# Patient Record
Sex: Male | Born: 1965 | Race: White | Hispanic: No | Smoking: Current every day smoker
Health system: Southern US, Community
[De-identification: ages and names within clinical notes are randomized; demographics above are authoritative.]

---

## 2004-11-20 ENCOUNTER — Ambulatory Visit (HOSPITAL_BASED_OUTPATIENT_CLINIC_OR_DEPARTMENT_OTHER): Admission: RE | Admit: 2004-11-20 | Discharge: 2004-11-20 | Payer: Self-pay | Admitting: *Deleted

## 2004-11-20 ENCOUNTER — Ambulatory Visit (HOSPITAL_COMMUNITY): Admission: RE | Admit: 2004-11-20 | Discharge: 2004-11-20 | Payer: Self-pay | Admitting: *Deleted

## 2005-05-05 ENCOUNTER — Emergency Department (HOSPITAL_COMMUNITY): Admission: EM | Admit: 2005-05-05 | Discharge: 2005-05-06 | Payer: Self-pay | Admitting: Emergency Medicine

## 2005-08-29 ENCOUNTER — Ambulatory Visit (HOSPITAL_COMMUNITY): Admission: RE | Admit: 2005-08-29 | Discharge: 2005-08-29 | Payer: Self-pay | Admitting: Family Medicine

## 2007-12-09 ENCOUNTER — Emergency Department (HOSPITAL_COMMUNITY): Admission: EM | Admit: 2007-12-09 | Discharge: 2007-12-09 | Payer: Self-pay | Admitting: Emergency Medicine

## 2008-03-13 ENCOUNTER — Inpatient Hospital Stay (HOSPITAL_COMMUNITY): Admission: EM | Admit: 2008-03-13 | Discharge: 2008-03-16 | Payer: Self-pay | Admitting: *Deleted

## 2008-12-06 ENCOUNTER — Emergency Department (HOSPITAL_COMMUNITY): Admission: EM | Admit: 2008-12-06 | Discharge: 2008-12-06 | Payer: Self-pay | Admitting: Emergency Medicine

## 2009-07-07 ENCOUNTER — Emergency Department (HOSPITAL_COMMUNITY): Admission: EM | Admit: 2009-07-07 | Discharge: 2009-07-07 | Payer: Self-pay | Admitting: Emergency Medicine

## 2009-07-21 ENCOUNTER — Ambulatory Visit: Payer: Self-pay | Admitting: Internal Medicine

## 2009-07-21 ENCOUNTER — Inpatient Hospital Stay (HOSPITAL_COMMUNITY): Admission: EM | Admit: 2009-07-21 | Discharge: 2009-07-22 | Payer: Self-pay | Admitting: Emergency Medicine

## 2009-07-22 ENCOUNTER — Ambulatory Visit: Payer: Self-pay | Admitting: Surgery

## 2009-07-22 ENCOUNTER — Encounter: Payer: Self-pay | Admitting: Internal Medicine

## 2009-07-24 ENCOUNTER — Encounter: Payer: Self-pay | Admitting: Internal Medicine

## 2009-07-25 ENCOUNTER — Encounter: Payer: Self-pay | Admitting: Internal Medicine

## 2009-07-25 ENCOUNTER — Telehealth: Payer: Self-pay | Admitting: Internal Medicine

## 2009-08-07 ENCOUNTER — Emergency Department (HOSPITAL_COMMUNITY): Admission: EM | Admit: 2009-08-07 | Discharge: 2009-08-08 | Payer: Self-pay | Admitting: Emergency Medicine

## 2010-01-10 IMAGING — CR DG LUMBAR SPINE 2-3V
3 series · 3 of 3 positions shown · non-contrast
Comparison: None

CLINICAL DATA: Hip pain

LUMBAR SPINE - 2-3 VIEW

[t l-spine a.p.]
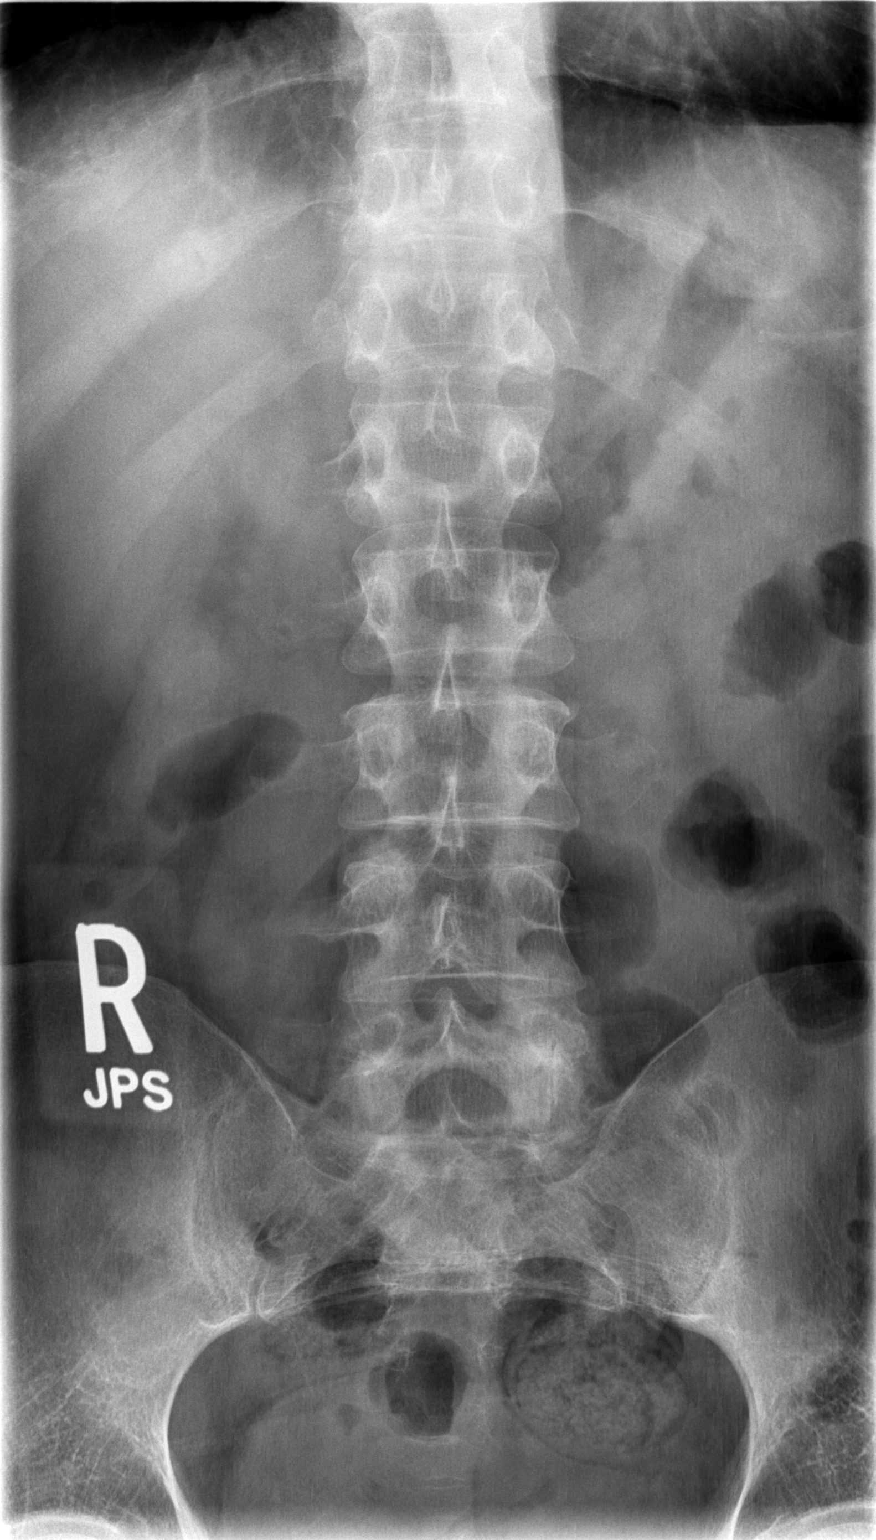

[t l-spine lat]
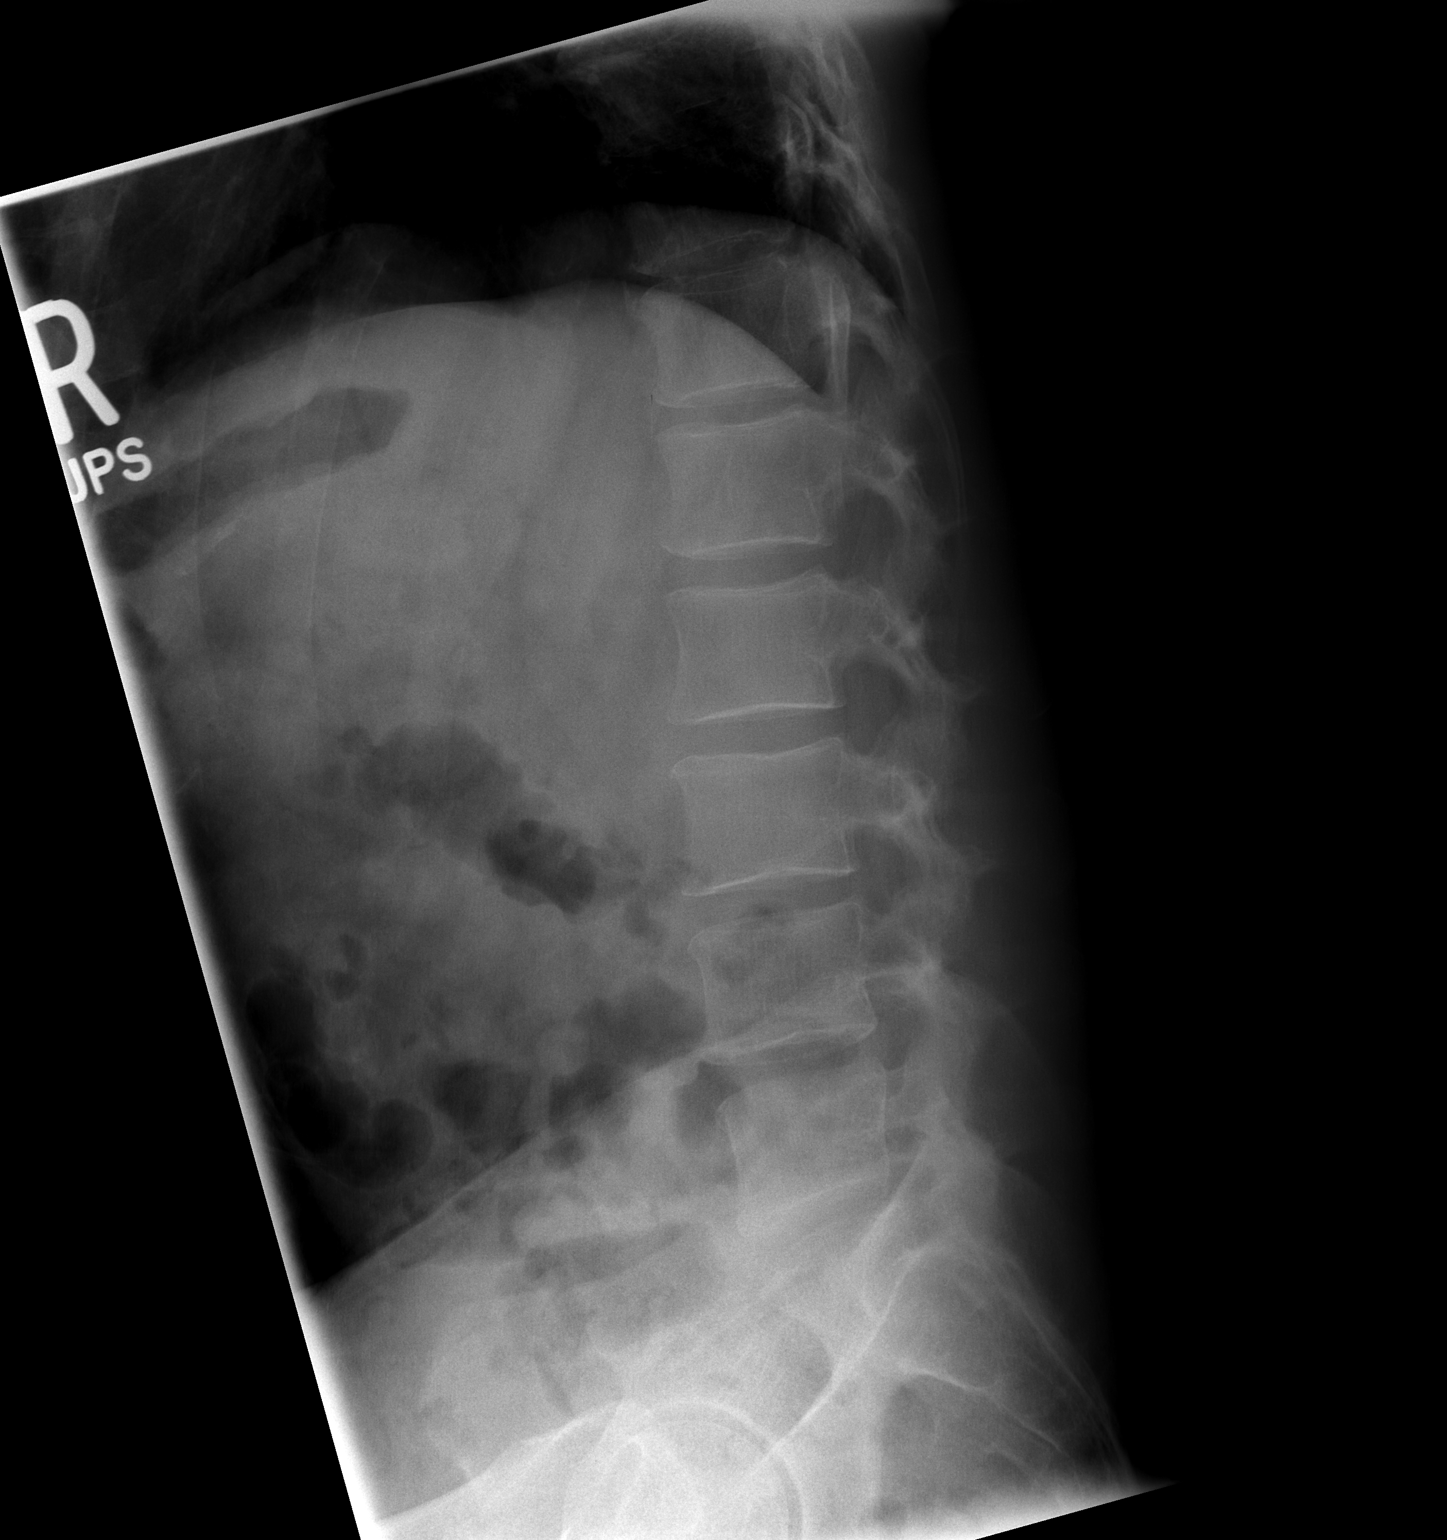

[t l-spine l5-s1 spot]
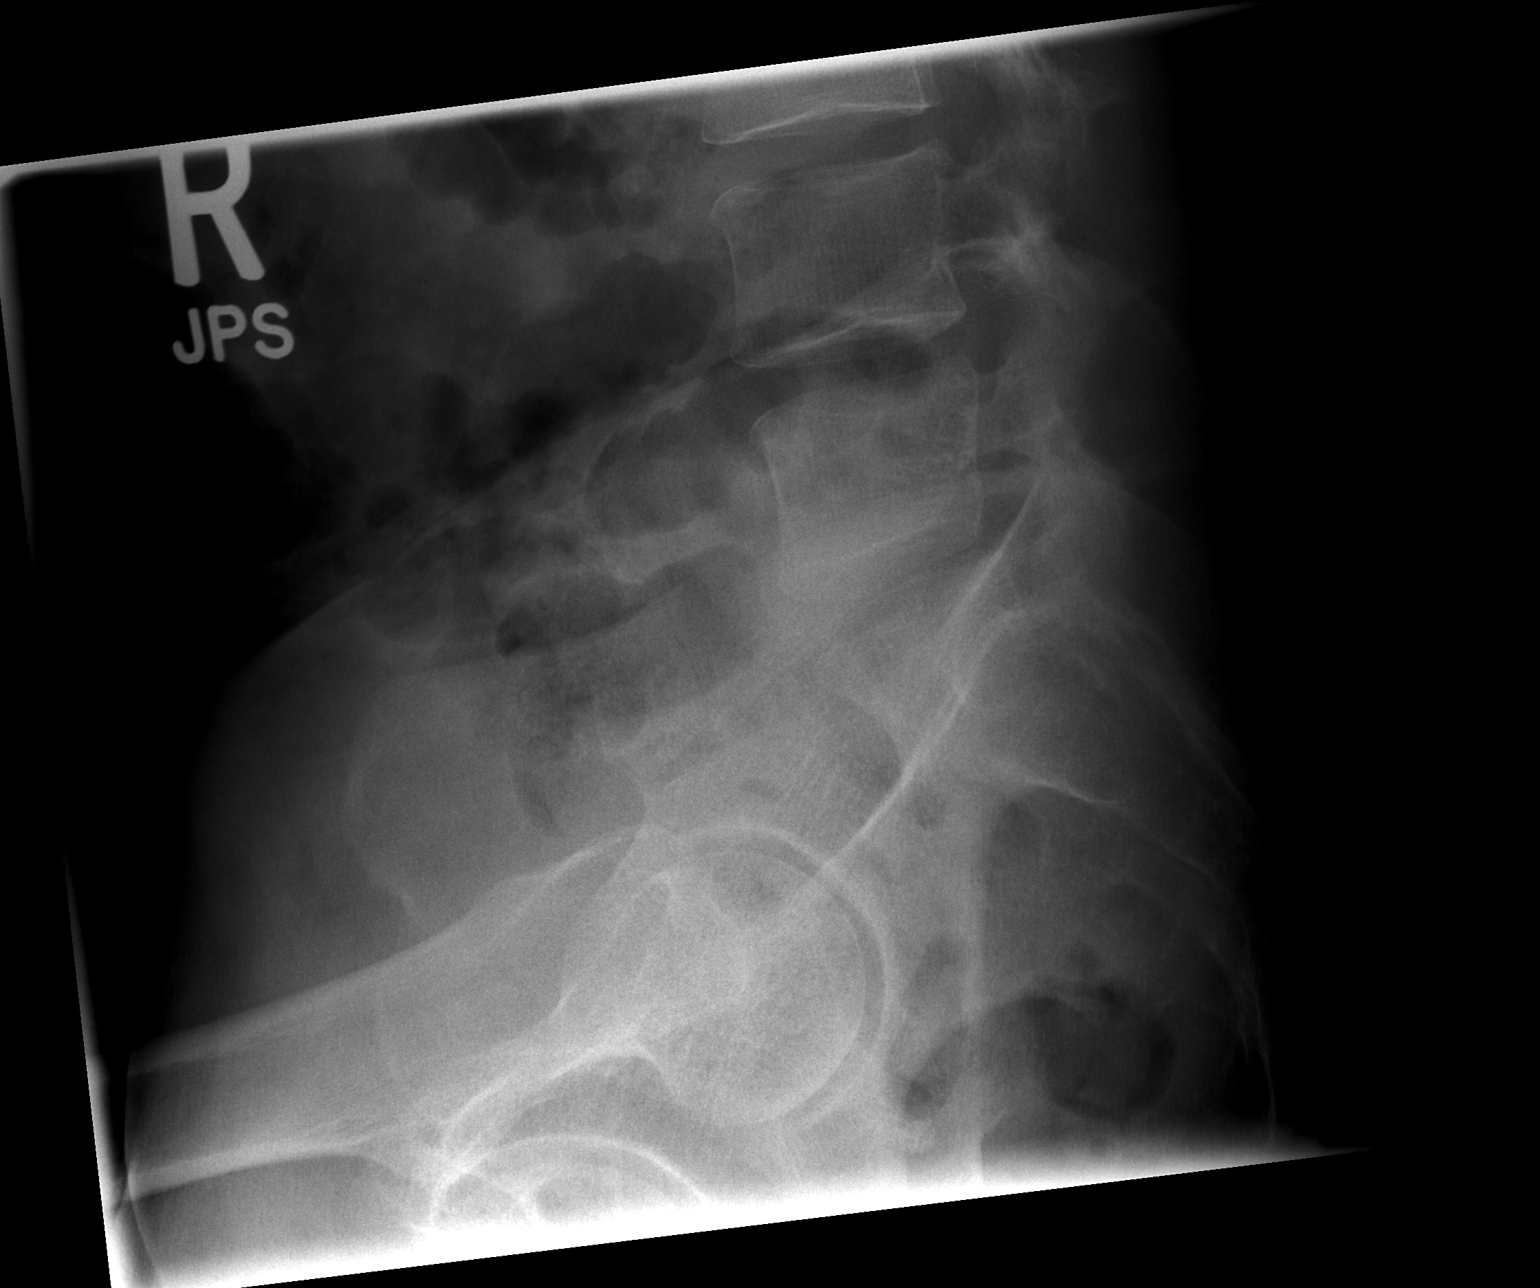

[3 of 3 positions shown; findings below may reference images not displayed]

FINDINGS: There is no evidence of lumbar spine fracture.  Alignment
is normal.  Intervertebral disc spaces are maintained.
IMPRESSION: Negative.

## 2010-01-10 IMAGING — CR DG HIP (WITH OR WITHOUT PELVIS) 2-3V*L*
2 series · 2 of 2 positions shown · non-contrast
Comparison: None available.

CLINICAL DATA: Trauma.  Fall.  Left hip pain.

LEFT HIP - COMPLETE 2+ VIEW

[t pelvis a.p.]
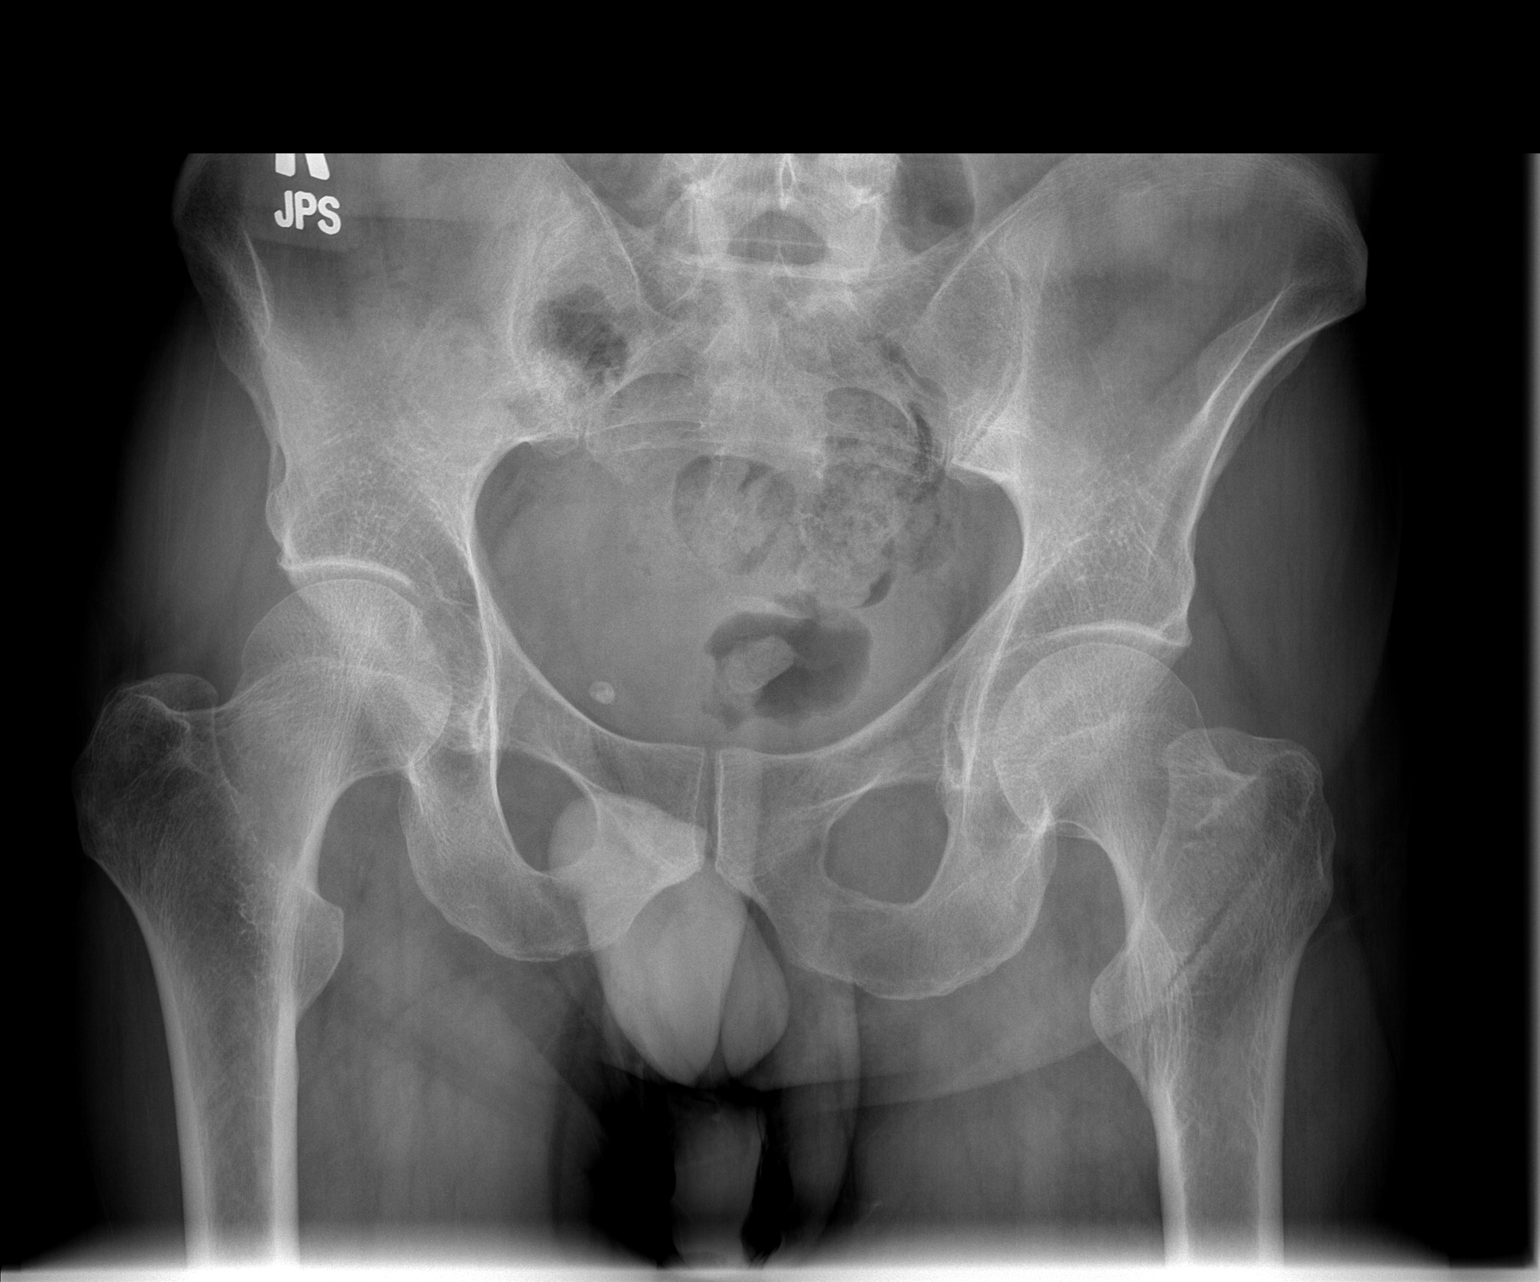

[t hip ap left]
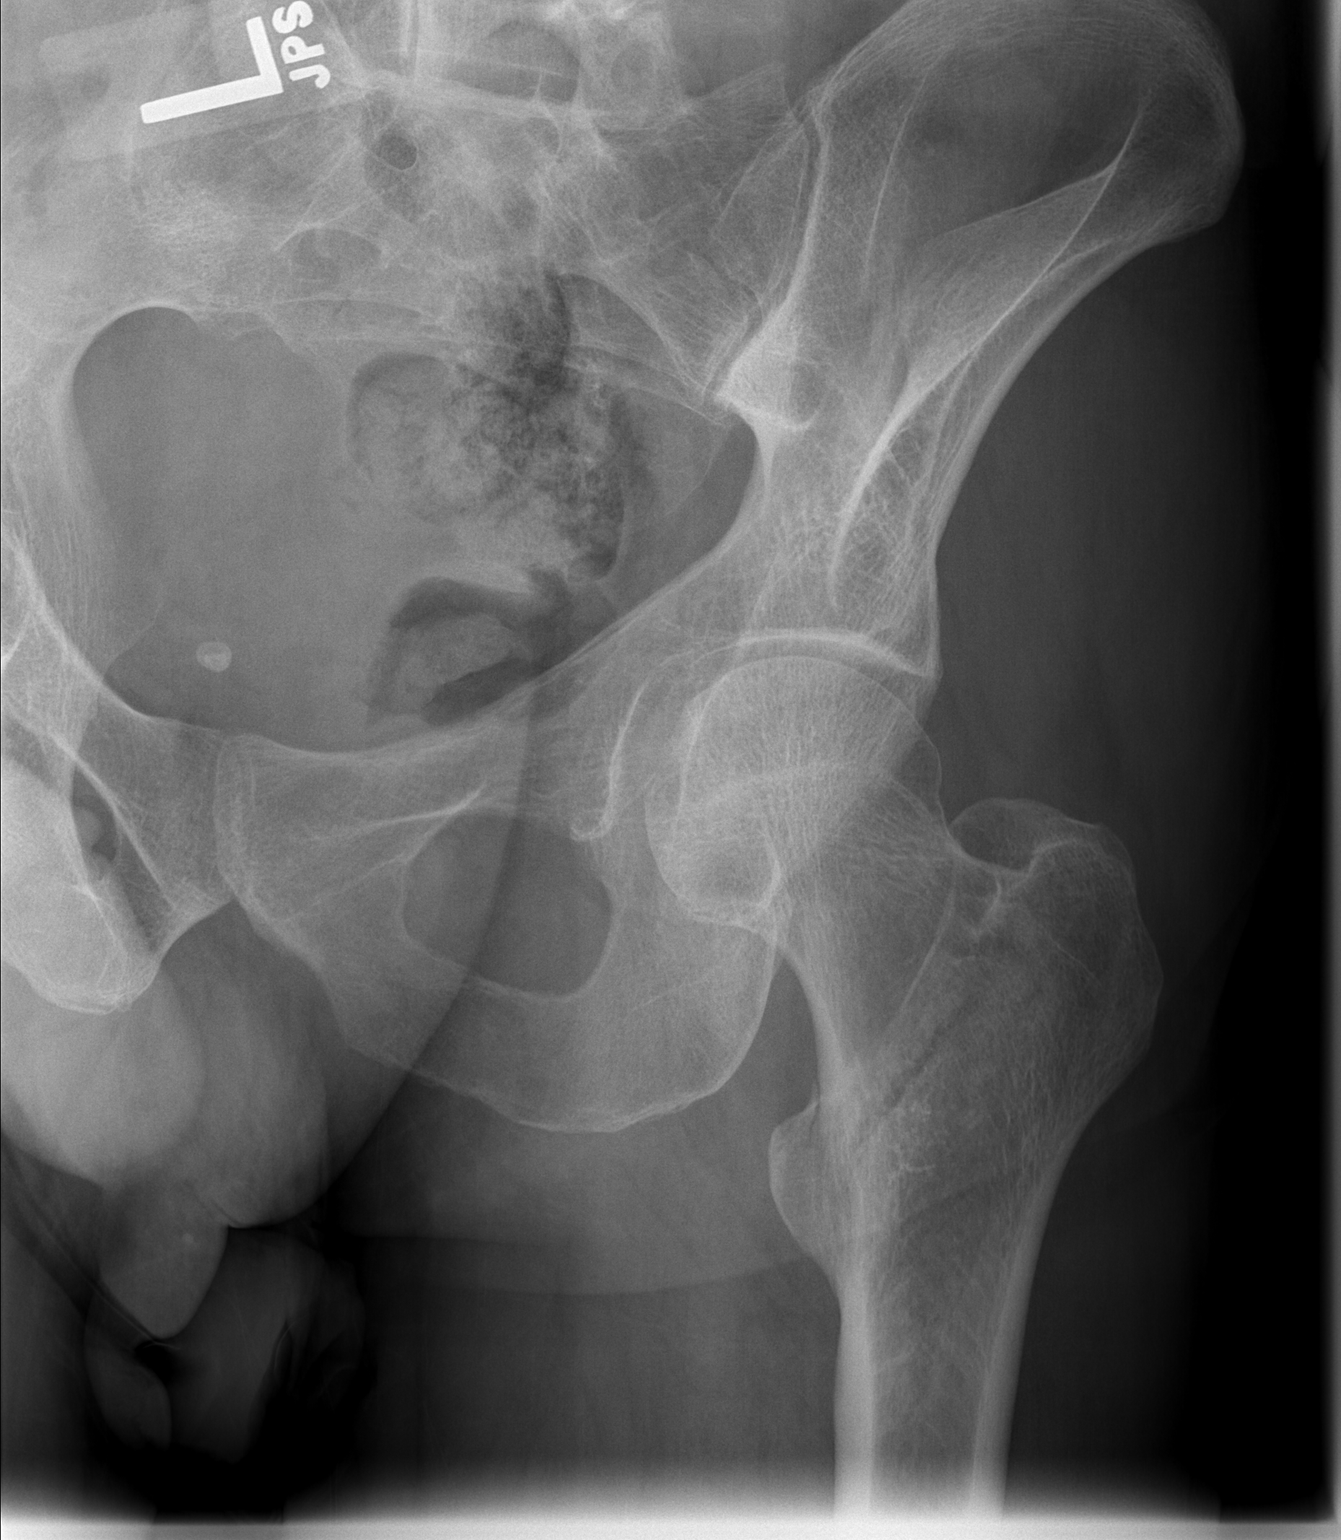

[2 of 2 positions shown; findings below may reference images not displayed]

FINDINGS: There is minimal displacement of an intertrochanteric
fracture of the left proximal femur.  The pelvis is otherwise
intact.  The femoral head is located.
IMPRESSION: 1.  Minimal displacement left intertrochanteric fracture.

## 2010-01-26 ENCOUNTER — Emergency Department (HOSPITAL_BASED_OUTPATIENT_CLINIC_OR_DEPARTMENT_OTHER): Admission: EM | Admit: 2010-01-26 | Discharge: 2010-01-27 | Payer: Self-pay | Admitting: Emergency Medicine

## 2010-04-07 NOTE — Progress Notes (Signed)
Summary: Forms  Phone Note Call from Patient   Caller: Patient Summary of Call: Call from pt says that he needs a note for Human Resources. Needs a verification for the light duty that has been ordered.  Needs something a little more specific.   Pt will bringing in forms for you to fill out. Angelina Ok RN  Jul 25, 2009 10:51 AM  Initial call taken by: Angelina Ok RN,  Jul 25, 2009 10:51 AM  Follow-up for Phone Call        Letter was already written and sent to Ms. Lucita Lora, RN. Ms. Lucita Lora is notified and she is to print out the letter and hand in to the patient. Thank you. Follow-up by: Deatra Robinson MD,  Jul 25, 2009 1:57 PM

## 2010-04-07 NOTE — Letter (Signed)
Summary: [place] [employer]   [place] [employer]   Imported By: Margie Billet 08/05/2009 13:59:51  _____________________________________________________________________  External Attachment:    Type:   Image     Comment:   External Document

## 2010-04-07 NOTE — Miscellaneous (Signed)
Summary: Letter  Please, excuse the patient from work 07/21/2009 through 07/24/2009. Patient may return to light duty with a weight lift restriction of no more than  40 lbs. Sincerely, Deatra Robinson, MD

## 2010-04-07 NOTE — Miscellaneous (Signed)
Summary: Work excuses   Please, excuse Mr. Jesus Clarke from work 07/21/2009 through 08/05/2009 due to medical condition. Weight lift restrictions to no more than 20 lbs for 8 weeks as of 07/21/2009.  Sincerely, Deatra Robinson, MD

## 2010-05-25 LAB — POCT I-STAT, CHEM 8
Calcium, Ion: 1 mmol/L — ABNORMAL LOW (ref 1.12–1.32)
Chloride: 108 mEq/L (ref 96–112)
Creatinine, Ser: 0.9 mg/dL (ref 0.4–1.5)
HCT: 54 % — ABNORMAL HIGH (ref 39.0–52.0)
Hemoglobin: 18.4 g/dL — ABNORMAL HIGH (ref 13.0–17.0)
Potassium: 4 mEq/L (ref 3.5–5.1)
Sodium: 139 mEq/L (ref 135–145)

## 2010-05-25 LAB — COMPREHENSIVE METABOLIC PANEL
CO2: 27 mEq/L (ref 19–32)
Calcium: 9.1 mg/dL (ref 8.4–10.5)
Creatinine, Ser: 0.87 mg/dL (ref 0.4–1.5)
GFR calc Af Amer: >60 mL/min (ref >60)
GFR calc non Af Amer: >60 mL/min (ref >60)
Glucose, Bld: 106 mg/dL — ABNORMAL HIGH (ref 70–99)
Total Protein: 6.6 g/dL (ref 6.0–8.3)

## 2010-05-25 LAB — CBC
HCT: 50 % (ref 39.0–52.0)
Hemoglobin: 16.2 g/dL (ref 13.0–17.0)
Hemoglobin: 17.7 g/dL — ABNORMAL HIGH (ref 13.0–17.0)
MCHC: 35 g/dL (ref 30.0–36.0)
MCHC: 35.4 g/dL (ref 30.0–36.0)
MCV: 101.1 fL — ABNORMAL HIGH (ref 78.0–100.0)
RBC: 4.58 MIL/uL (ref 4.22–5.81)
RDW: 12.8 % (ref 11.5–15.5)
RDW: 13 % (ref 11.5–15.5)
WBC: 9.7 10*3/uL (ref 4.0–10.5)

## 2010-05-25 LAB — DIFFERENTIAL
Basophils Absolute: 0 10*3/uL (ref 0.0–0.1)
Basophils Relative: 0 % (ref 0–1)
Eosinophils Absolute: 0.2 10*3/uL (ref 0.0–0.7)
Eosinophils Relative: 2 % (ref 0–5)
Neutro Abs: 7.7 10*3/uL (ref 1.7–7.7)
Neutrophils Relative %: 79 % — ABNORMAL HIGH (ref 43–77)

## 2010-05-25 LAB — URINALYSIS, MICROSCOPIC ONLY
Ketones, ur: NEGATIVE mg/dL
Leukocytes, UA: NEGATIVE
Protein, ur: NEGATIVE mg/dL
Urobilinogen, UA: 1 mg/dL (ref 0.0–1.0)

## 2010-05-25 LAB — AFB CULTURE, BLOOD

## 2010-05-25 LAB — TSH: TSH: 0.651 u[IU]/mL (ref 0.350–4.500)

## 2010-06-11 LAB — RAPID URINE DRUG SCREEN, HOSP PERFORMED
Amphetamines: NOT DETECTED
Benzodiazepines: NOT DETECTED
Cocaine: NOT DETECTED

## 2010-06-11 LAB — ETHANOL: Alcohol, Ethyl (B): 72 mg/dL — ABNORMAL HIGH (ref 0–10)

## 2010-06-11 LAB — CBC
HCT: 41.2 % (ref 39.0–52.0)
Hemoglobin: 14.4 g/dL (ref 13.0–17.0)
MCV: 97.6 fL (ref 78.0–100.0)
RBC: 4.22 MIL/uL (ref 4.22–5.81)
WBC: 5.9 10*3/uL (ref 4.0–10.5)

## 2010-06-11 LAB — URINALYSIS, ROUTINE W REFLEX MICROSCOPIC
Bilirubin Urine: NEGATIVE
Glucose, UA: NEGATIVE mg/dL
Hgb urine dipstick: NEGATIVE
Ketones, ur: NEGATIVE mg/dL
pH: 5.5 (ref 5.0–8.0)

## 2010-06-11 LAB — DIFFERENTIAL
Eosinophils Relative: 1 % (ref 0–5)
Lymphocytes Relative: 28 % (ref 12–46)
Lymphs Abs: 1.7 10*3/uL (ref 0.7–4.0)
Monocytes Absolute: 0.4 10*3/uL (ref 0.1–1.0)
Monocytes Relative: 7 % (ref 3–12)

## 2010-06-11 LAB — BASIC METABOLIC PANEL
Chloride: 106 mEq/L (ref 96–112)
GFR calc non Af Amer: >60 mL/min (ref >60)
Potassium: 3.7 mEq/L (ref 3.5–5.1)
Sodium: 141 mEq/L (ref 135–145)

## 2010-06-22 LAB — SAMPLE TO BLOOD BANK

## 2010-06-22 LAB — CBC
HCT: 40 % (ref 39.0–52.0)
Hemoglobin: 13.1 g/dL (ref 13.0–17.0)
MCHC: 33.8 g/dL (ref 30.0–36.0)
MCV: 101.9 fL — ABNORMAL HIGH (ref 78.0–100.0)
MCV: 104.4 fL — ABNORMAL HIGH (ref 78.0–100.0)
Platelets: 278 10*3/uL (ref 150–400)
RBC: 3.72 MIL/uL — ABNORMAL LOW (ref 4.22–5.81)
RDW: 13.6 % (ref 11.5–15.5)

## 2010-06-22 LAB — DIFFERENTIAL
Basophils Absolute: 0.1 10*3/uL (ref 0.0–0.1)
Eosinophils Absolute: 0 10*3/uL (ref 0.0–0.7)
Eosinophils Relative: 0 % (ref 0–5)
Lymphocytes Relative: 10 % — ABNORMAL LOW (ref 12–46)

## 2010-06-22 LAB — PROTIME-INR: Prothrombin Time: 13.4 seconds (ref 11.6–15.2)

## 2010-06-22 LAB — ETHANOL: Alcohol, Ethyl (B): 213 mg/dL — ABNORMAL HIGH (ref 0–10)

## 2010-06-22 LAB — COMPREHENSIVE METABOLIC PANEL
ALT: 21 U/L (ref 0–53)
CO2: 24 mEq/L (ref 19–32)
Calcium: 8.5 mg/dL (ref 8.4–10.5)
Creatinine, Ser: 0.87 mg/dL (ref 0.4–1.5)
GFR calc non Af Amer: >60 mL/min (ref >60)
Glucose, Bld: 111 mg/dL — ABNORMAL HIGH (ref 70–99)

## 2010-06-22 LAB — POCT I-STAT, CHEM 8
Hemoglobin: 14.3 g/dL (ref 13.0–17.0)
Sodium: 141 mEq/L (ref 135–145)
TCO2: 20 mmol/L (ref 0–100)

## 2010-07-21 NOTE — Op Note (Signed)
NAME:  Jesus Clarke, Jesus Clarke                ACCOUNT NO.:  [id number]   MEDICAL RECORD NO.:  [id number]          PATIENT TYPE:  INP   LOCATION:  5014                         FACILITY:  [facility]   PHYSICIAN:  Burnard Bunting, M.D.    DATE OF BIRTH:  1965-07-30   DATE OF PROCEDURE:  DATE OF DISCHARGE:                               OPERATIVE REPORT   PREOPERATIVE DIAGNOSIS:  Left hip intertrochanteric fracture.   POSTOPERATIVE DIAGNOSIS:  Left hip intertrochanteric fracture.   PROCEDURE:  Left hip intertrochanteric fracture open reduction and  internal fixation.   SURGEON:  Burnard Bunting, MD   ASSISTANT:  None.   ANESTHESIA:  General endotracheal.   ESTIMATED BLOOD LOSS:  100.   INDICATIONS:  [name] is an ambulatory 45 year old patient with left  hip fracture who presents now for operative management after explanation  of risks and benefits.   PROCEDURE IN DETAIL:  The patient was brought to the operating room  where general endotracheal anesthesia was induced.  Preoperative  antibiotics were administered.  The patient was placed on the fracture  table with the right leg in lithotomy position with peroneal nerve well-  padded, left leg draped.  The fracture was then reduced with traction,  internal rotation, and checked under fluoroscopy in AP and lateral  planes.  Hip area was then prepped with DuraPrep solution and draped in  sterile manner.  [name] was then used to cover the operative field.  Under fluoroscopic guidance, the tip of the trochanter and the course of  the femur was identified and incision was made 4 fingerbreadths proximal  to the greater trochanter.  Incision was made, guide pin was placed to  the tip of the trochanter and checked under fluoroscopic guidance for  center location through the trochanter into the femoral shaft.  Proximal  opening reaming was performed in accordance with preoperative  templating, a 10 x 36 nail was placed.  A separate incision was then  used  to place the compression screw with good fracture reduction.  Correct placement was checked in the AP and lateral planes under  fluoroscopy.  Excellent bony purchase was achieved.  Distally, the nail  was in good position within the femur.  At this time, both incisions  were thoroughly irrigated.  A second internal compression screw was  placed to achieve compression across the fracture site with release of  traction.  After both incisions were irrigated, they were closed using 0-  Vicryl suture, 2-0 Vicryl suture, and skin staples.  Mepilex dressing  was applied.  The patient tolerated the procedure well without immediate  complications.     Burnard Bunting, M.D.  Electronically Signed    GSD/MEDQ  D:  03/13/2008  T:  03/14/2008  Job:  [id number]

## 2010-07-21 NOTE — H&P (Signed)
NAME:  Jesus Clarke                ACCOUNT NO.:  [id number]   MEDICAL RECORD NO.:  [id number]          PATIENT TYPE:  INP   LOCATION:  5014                         FACILITY:  [facility]   PHYSICIAN:  Burnard Bunting, M.D.    DATE OF BIRTH:  19-Nov-1965   DATE OF ADMISSION:  03/13/2008  DATE OF DISCHARGE:                              HISTORY & PHYSICAL   CHIEF COMPLAINT:  Left hip pain.   HISTORY OF PRESENT ILLNESS:  Jesus Clarke is a 45 year old patient with  left hip pain.  He was lifted up and thrown down by a friend when they  were wrestling onto his left hip.  There was no loss of consciousness.  He was unable to ambulate following the injury.  He denies any other  orthopedic complaints.  He denies any neck pain.   PAST MEDICAL HISTORY:  Negative.   PAST SURGICAL HISTORY:  Negative.   MEDICATIONS:  None.   ALLERGIES:  PENICILLIN.   SOCIAL HISTORY:  The patient is unemployed currently.  He drinks about a  pint of alcohol per day.  He does smoke every day.  No history of IV  drug abuse.  He is here with his mother.  He does have other family in  [place].   PHYSICAL EXAMINATION:  GENERAL:  He is well developed and well-  nourished, in mild distress due to pain.  He is alert and oriented x3.  VITAL SIGNS:  His blood pressure is 123/81, heart rate is 78,  respiration rate is 28, and O2 saturation 97% on room air.  NECK:  Range of motion is nontender.  HEENT:  Pupils are equal.  CHEST:  Clear to auscultation.  HEART:  Regular rate and rhythm.  ABDOMEN:  Benign.  EXTREMITIES:  Bilateral upper extremity has full range of motion.  Left  lower extremity DP pulse 2+/4 knee.  Knee is nontender to palpation.  Dorsiflexion and plantar flexion are intact on the left lower extremity.  Right lower extremity range of motion is intact.  There is no bruising  on the hip region.   White count of 13,000, hemoglobin 13.8, and platelets 278.  Alcohol  level is 213.  Sodium and potassium 141 and  3.8.  BUN and creatinine 5  and 0.7.  Glucose 107.  PT and PTT are pending.  Radiographs of the hip  show an intertrochanteric fracture on the left with minimal  displacement.  Chest x-ray shows COPD.  EKG is pending.  Lumbar x-rays  are negative.   IMPRESSION:  Left hip fracture in an ambulatory patient with history of  smoking and alcohol use.   PLAN:  Open reduction and internal fixation.  Risks and benefits were  discussed with the patient and his mother including but not limited to  infection, DVT, nonunion, malunion, and need for more surgery.  All  questions were answered.      Burnard Bunting, M.D.  Electronically Signed     GSD/MEDQ  D:  03/13/2008  T:  03/13/2008  Job:  [id number]

## 2010-07-24 NOTE — Discharge Summary (Signed)
NAMEEYAN, HAGOOD                ACCOUNT NO.:  [id number]   MEDICAL RECORD NO.:  [id number]          PATIENT TYPE:  INP   LOCATION:  5014                         FACILITY:  [facility]   PHYSICIAN:  Burnard Bunting, M.D.    DATE OF BIRTH:  03-30-1965   DATE OF ADMISSION:  03/13/2008  DATE OF DISCHARGE:  03/16/2008                               DISCHARGE SUMMARY   ADMISSION DIAGNOSES:  1. Left intertrochanteric hip fracture.  2. Daily alcohol use.  3. Tobacco abuse.   DISCHARGE DIAGNOSES:  1. Left intertrochanteric hip fracture.  2. Daily alcohol use.  3. Tobacco abuse.  4. Postoperative hyponatremia.   PROCEDURE:  On March 14, 2007, the patient underwent left hip  intertrochanteric fracture open reduction and internal fixation  performed by Dr. August Saucer under general anesthesia.   CONSULTATIONS:  None.   BRIEF HISTORY:  The patient is a 45 year old male who presented to the  emergency room on the day of admission with complaints of left hip pain  after wrestling with a friend.  Radiographs showed a left  intertrochanteric hip fracture which was displaced and would require  surgical intervention.  He was admitted for the procedure as stated  above.   BRIEF HOSPITAL COURSE:  The patient tolerated the procedure under  general anesthesia without complications.  Postoperatively, he was  placed on Coumadin for DVT prophylaxis.  Adjustments in Coumadin made  according to daily protimes by the pharmacist at [facility].  At discharge,  the patient's INR was noted to be 1.0.  The patient was started on  physical therapy for ambulation and gait training.  He was allowed 50%  partial weightbearing on the left lower extremity.  Prior to discharge,  he was ambulating as much as 140 feet.  Occupational therapy assisted  the patient with ADLs and he tolerated this well.  Dressing changes were  done daily, and the patient's wound was healing without drainage.  Neurovascular motor function of the lower  extremities remained intact  throughout the hospital stay.  The patient initially utilized PCA  analgesics and was weaned to p.o. analgesics without difficulty.  At  discharge, the patient was afebrile, vital signs were stable.  He was  voiding without difficulty and taking a regular diet.   OTHER PERTINENT LABS ON ADMISSION:  CBC with hemoglobin 13.8, hematocrit  40.0.  At discharge, hemoglobin 13.1, hematocrit 38.8.  Coagulation  studies on admission were within normal limits.  At discharge, INR 1.0.  Chemistry studies on admission within normal limits with exception of  BUN 5.  Repeat chemistry studies on March 16, 2008, showed sodium 127,  chloride 94, glucose 111, albumin 3.1.  Alcohol content on admission  213.  EKG on admission normal sinus rhythm.   PLAN:  The patient was discharged to his home.  He will continue to be  50% partial weightbearing on the operative extremity.  He was advised to  keep his incision dry and clean at all times and change the dressing as  needed.  The patient will follow up with Dr. August Saucer approximately 10 days  from  discharge and is advised to call to arrange the appointment.   Medications at discharge include:  1. Percocet 5/325 one to two every 4-6 hours as needed for pain.  2. Robaxin 500 mg 1 every 8 hours as needed for spasm.  3. Coumadin 5 mg daily for 3 weeks.   Durable medical equipment was made available to the patient at  discharge.  All questions encouraged and answered.   CONDITION ON DISCHARGE:  Stable.      Wende Neighbors, P.A.      Burnard Bunting, M.D.  Electronically Signed    SMV/MEDQ  D:  04/18/2008  T:  04/18/2008  Job:  [id number]

## 2010-07-24 NOTE — Op Note (Signed)
NAME:  Jesus Clarke, Jesus Clarke                ACCOUNT NO.:  [id number]   MEDICAL RECORD NO.:  [id number]          PATIENT TYPE:  AMB   LOCATION:  DSC                          FACILITY:  [facility]   PHYSICIAN:  Tennis Must Meyerdierks, M.D.DATE OF BIRTH:  03-Mar-1966   DATE OF PROCEDURE:  11/20/2004  DATE OF DISCHARGE:                                 OPERATIVE REPORT   PREOPERATIVE DIAGNOSIS:  Partial flexor tendon laceration, left palm.   POSTOPERATIVE DIAGNOSIS:  Partial flexor tendon laceration, left palm.   PROCEDURE:  Repair of partial flexor tendon laceration, left palm, with  release of A1 pulley, left long.   SURGEON:  Lowell Bouton, M.D.   ANESTHESIA:  General.   OPERATIVE FINDINGS:  The patient had a glass puncture wound over the volar  aspect of the third MP joint in the palm.  The flexor digitorum  superficialis tendon was partially lacerated.  Approximately 50% of the  tendon was lacerated.  It was catching on the A1 pulley.   PROCEDURE:  Under general anesthesia with a tourniquet on the left arm, the  left hand was prepped and draped in the usual fashion.  After elevating the  limb, the tourniquet inflated to 250 mmHg.  The laceration was extended  proximally and distally in a zigzag fashion.  Blunt dissection was carried  through the subcutaneous tissues down to the flexor sheath.  The flexor  sheath was torn and was completely released at the A1 pulley.  The FDS  tendon was found to have a partial laceration that was hanging up on the  pulley with attempted motion.  The laceration in the tendon was repaired  with a 6-0 nylon suture to smooth out the edge of the tendon so there was no  further impingement.  The wound was then irrigated copiously with saline;  0.5% Marcaine was inserted for pain control.  The wound was closed with 4-0  nylon sutures.  Sterile dressings were applied.  Tourniquet was released  with good circulation of the hand.  The patient went to the  recovery room,  awake and stable, in good condition.      Lowell Bouton, M.D.  Electronically Signed     EMM/MEDQ  D:  11/20/2004  T:  11/21/2004  Job:  [id number]   cc:   Onalee Hua L. Reed Breech, M.D.  [facility]  [address]  [place], [place] [address]  Fax: [phone]

## 2018-02-25 ENCOUNTER — Emergency Department (HOSPITAL_COMMUNITY)
Admission: EM | Admit: 2018-02-25 | Discharge: 2018-02-25 | Disposition: A | Discharge status: Home / Self Care | Payer: Medicaid Other | Attending: Emergency Medicine | Admitting: Emergency Medicine

## 2018-02-25 ENCOUNTER — Encounter (HOSPITAL_COMMUNITY): Payer: Self-pay | Admitting: *Deleted

## 2018-02-25 ENCOUNTER — Other Ambulatory Visit: Payer: Self-pay

## 2018-02-25 DIAGNOSIS — F10929 Alcohol use, unspecified with intoxication, unspecified: Secondary | ICD-10-CM | POA: Insufficient documentation

## 2018-02-25 DIAGNOSIS — F17210 Nicotine dependence, cigarettes, uncomplicated: Secondary | ICD-10-CM | POA: Insufficient documentation

## 2018-02-25 DIAGNOSIS — F1010 Alcohol abuse, uncomplicated: Secondary | ICD-10-CM

## 2018-02-25 LAB — COMPREHENSIVE METABOLIC PANEL
ALBUMIN: 3.9 g/dL (ref 3.5–5.0)
ALT: 23 U/L (ref 0–44)
AST: 28 U/L (ref 15–41)
Alkaline Phosphatase: 76 U/L (ref 38–126)
Anion gap: 13 (ref 5–15)
BUN: 11 mg/dL (ref 6–20)
CO2: 19 mmol/L — ABNORMAL LOW (ref 22–32)
Calcium: 9.3 mg/dL (ref 8.9–10.3)
Chloride: 101 mmol/L (ref 98–111)
Creatinine, Ser: 1.06 mg/dL (ref 0.61–1.24)
GFR calc Af Amer: >60 mL/min (ref >60)
GFR calc non Af Amer: >60 mL/min (ref >60)
GLUCOSE: 102 mg/dL — AB (ref 70–99)
Potassium: 3.8 mmol/L (ref 3.5–5.1)
Sodium: 133 mmol/L — ABNORMAL LOW (ref 135–145)
Total Bilirubin: 0.6 mg/dL (ref 0.3–1.2)
Total Protein: 7.6 g/dL (ref 6.5–8.1)

## 2018-02-25 LAB — MAGNESIUM: Magnesium: 2.1 mg/dL (ref 1.7–2.4)

## 2018-02-25 LAB — CBC WITH DIFFERENTIAL/PLATELET
Abs Immature Granulocytes: 0.02 10*3/uL (ref 0.00–0.07)
Basophils Absolute: 0.1 10*3/uL (ref 0.0–0.1)
Basophils Relative: 1 %
Eosinophils Absolute: 0.2 10*3/uL (ref 0.0–0.5)
Eosinophils Relative: 2 %
HCT: 39.5 % (ref 39.0–52.0)
HEMOGLOBIN: 13.2 g/dL (ref 13.0–17.0)
Immature Granulocytes: 0 %
Lymphocytes Relative: 19 %
Lymphs Abs: 1.6 10*3/uL (ref 0.7–4.0)
MCH: 33.8 pg (ref 26.0–34.0)
MCHC: 33.4 g/dL (ref 30.0–36.0)
MCV: 101 fL — ABNORMAL HIGH (ref 80.0–100.0)
MONOS PCT: 7 %
Monocytes Absolute: 0.6 10*3/uL (ref 0.1–1.0)
Neutro Abs: 6.1 10*3/uL (ref 1.7–7.7)
Neutrophils Relative %: 71 %
Platelets: 442 10*3/uL — ABNORMAL HIGH (ref 150–400)
RBC: 3.91 MIL/uL — ABNORMAL LOW (ref 4.22–5.81)
RDW: 12.1 % (ref 11.5–15.5)
WBC: 8.5 10*3/uL (ref 4.0–10.5)
nRBC: 0 % (ref 0.0–0.2)

## 2018-02-25 LAB — CBG MONITORING, ED: Glucose-Capillary: 114 mg/dL — ABNORMAL HIGH (ref 70–99)

## 2018-02-25 MED ORDER — TRAZODONE HCL 100 MG PO TABS
100.00 | ORAL_TABLET | ORAL | Status: DC
Start: ? — End: 2018-02-25

## 2018-02-25 MED ORDER — BISACODYL 5 MG PO TBEC
10.00 | DELAYED_RELEASE_TABLET | ORAL | Status: DC
Start: ? — End: 2018-02-25

## 2018-02-25 MED ORDER — DOCUSATE SODIUM 100 MG PO CAPS
100.00 | ORAL_CAPSULE | ORAL | Status: DC
Start: ? — End: 2018-02-25

## 2018-02-25 MED ORDER — GENERIC EXTERNAL MEDICATION
5.00 | Status: DC
Start: ? — End: 2018-02-25

## 2018-02-25 MED ORDER — ZIPRASIDONE MESYLATE 20 MG IM SOLR
20.00 | INTRAMUSCULAR | Status: DC
Start: ? — End: 2018-02-25

## 2018-02-25 MED ORDER — GUAIFENESIN 100 MG/5ML PO SYRP
200.00 | ORAL_SOLUTION | ORAL | Status: DC
Start: ? — End: 2018-02-25

## 2018-02-25 MED ORDER — HYDROXYZINE HCL 25 MG PO TABS
50.00 | ORAL_TABLET | ORAL | Status: DC
Start: ? — End: 2018-02-25

## 2018-02-25 MED ORDER — DIPHENHYDRAMINE HCL 25 MG PO CAPS
50.00 | ORAL_CAPSULE | ORAL | Status: DC
Start: ? — End: 2018-02-25

## 2018-02-25 MED ORDER — NICOTINE POLACRILEX 2 MG MT GUM
2.00 | CHEWING_GUM | OROMUCOSAL | Status: DC
Start: ? — End: 2018-02-25

## 2018-02-25 MED ORDER — LORAZEPAM 2 MG/ML IJ SOLN
2.00 | INTRAMUSCULAR | Status: DC
Start: ? — End: 2018-02-25

## 2018-02-25 MED ORDER — DULOXETINE HCL 30 MG PO CPEP
30.00 | ORAL_CAPSULE | ORAL | Status: DC
Start: 2018-02-25 — End: 2018-02-25

## 2018-02-25 MED ORDER — VERAPAMIL HCL ER 120 MG PO TBCR
120.00 | EXTENDED_RELEASE_TABLET | ORAL | Status: DC
Start: 2018-02-24 — End: 2018-02-25

## 2018-02-25 MED ORDER — ALBUTEROL SULFATE HFA 108 (90 BASE) MCG/ACT IN AERS
2.00 | INHALATION_SPRAY | RESPIRATORY_TRACT | Status: DC
Start: ? — End: 2018-02-25

## 2018-02-25 MED ORDER — DIPHENHYDRAMINE HCL 50 MG/ML IJ SOLN
50.00 | INTRAMUSCULAR | Status: DC
Start: ? — End: 2018-02-25

## 2018-02-25 MED ORDER — HALOPERIDOL 5 MG PO TABS
5.00 | ORAL_TABLET | ORAL | Status: DC
Start: ? — End: 2018-02-25

## 2018-02-25 MED ORDER — FLUTICASONE FUROATE-VILANTEROL 100-25 MCG/INH IN AEPB
1.00 | INHALATION_SPRAY | RESPIRATORY_TRACT | Status: DC
Start: 2018-02-25 — End: 2018-02-25

## 2018-02-25 MED ORDER — CLONIDINE HCL 0.1 MG PO TABS
0.10 | ORAL_TABLET | ORAL | Status: DC
Start: ? — End: 2018-02-25

## 2018-02-25 MED ORDER — GENERIC EXTERNAL MEDICATION
32.40 | Status: DC
Start: ? — End: 2018-02-25

## 2018-02-25 MED ORDER — BENZOCAINE-MENTHOL 6-10 MG MT LOZG
1.00 | LOZENGE | OROMUCOSAL | Status: DC
Start: ? — End: 2018-02-25

## 2018-02-25 MED ORDER — BENZTROPINE MESYLATE 1 MG PO TABS
1.00 | ORAL_TABLET | ORAL | Status: DC
Start: ? — End: 2018-02-25

## 2018-02-25 MED ORDER — ALUMINUM-MAGNESIUM-SIMETHICONE 200-200-20 MG/5ML PO SUSP
30.00 | ORAL | Status: DC
Start: ? — End: 2018-02-25

## 2018-02-25 MED ORDER — METHOCARBAMOL 750 MG PO TABS
1500.00 | ORAL_TABLET | ORAL | Status: DC
Start: ? — End: 2018-02-25

## 2018-02-25 MED ORDER — SALINE NASAL SPRAY 0.65 % NA SOLN
1.00 | NASAL | Status: DC
Start: ? — End: 2018-02-25

## 2018-02-25 MED ORDER — FLUTICASONE PROPIONATE 50 MCG/ACT NA SUSP
2.00 | NASAL | Status: DC
Start: 2018-02-25 — End: 2018-02-25

## 2018-02-25 MED ORDER — NICOTINE 14 MG/24HR TD PT24
1.00 | MEDICATED_PATCH | TRANSDERMAL | Status: DC
Start: 2018-02-25 — End: 2018-02-25

## 2018-02-25 MED ORDER — PROMETHAZINE HCL 25 MG PO TABS
25.00 | ORAL_TABLET | ORAL | Status: DC
Start: ? — End: 2018-02-25

## 2018-02-25 MED ORDER — THIAMINE HCL 100 MG PO TABS
100.00 | ORAL_TABLET | ORAL | Status: DC
Start: 2018-02-25 — End: 2018-02-25

## 2018-02-25 MED ORDER — LOPERAMIDE HCL 2 MG PO CAPS
2.00 | ORAL_CAPSULE | ORAL | Status: DC
Start: ? — End: 2018-02-25

## 2018-02-25 MED ORDER — IBUPROFEN 400 MG PO TABS
400.00 | ORAL_TABLET | ORAL | Status: DC
Start: ? — End: 2018-02-25

## 2018-02-25 NOTE — ED Notes (Signed)
Pt being discharged he reports that he needs medicine for tremors  The pt had no symptoms minutes prior to his discharge  Pt asked if he was getting a ride somewhere  None provided

## 2018-02-25 NOTE — ED Provider Notes (Signed)
[facility] Provider Note   CSN: [id number] Arrival date & time: 02/25/18  [id number]     History   Chief Complaint Chief Complaint  Patient presents with  . Alcohol Intoxication    HPI Jesus Clarke is a 52 y.o. male.  HPI   Patient is a 52 year old male with a past medical history of alcohol abuse who presents for evaluation for concern of a "I think I have a stroke".  Patient is unable to find any form of linear history but states he is concerned he has a stroke because his voice sounds "off".  He denies any recent traumatic injuries or falls.  Denies any recent fevers, headache, chest pain, cough, shortness of breath, abdominal pain, nausea, vomiting, diarrhea, dysuria, rash, blood in his stool, blood in his urine, or other acute complaints.  Denies sick contacts or recent travel out of [place].  Denies IV drug use.  Endorses drinking an unspecified amount of alcohol over the past 4 to 5 days since he was discharged from [facility] after being hospitalized for chronic alcohol abuse and hyponatremia.  Patient also endorses snorting heroin earlier today.  He denies any other drug use.  Denies any other past medical history.  History reviewed. No pertinent past medical history.  There are no active problems to display for this patient.   History reviewed. No pertinent surgical history.      Home Medications    Prior to Admission medications   Not on File    Family History No family history on file.  Social History Social History   Tobacco Use  . Smoking status: Current Every Day Smoker  . Smokeless tobacco: Never Used  Substance Use Topics  . Alcohol use: Yes  . Drug use: Not on file     Allergies   Patient has no known allergies.   Review of Systems Review of Systems  Constitutional: Positive for fatigue. Negative for chills and fever.  HENT: Positive for voice change. Negative for ear pain and  sore throat.   Eyes: Negative for pain and visual disturbance.  Respiratory: Negative for cough and shortness of breath.   Cardiovascular: Negative for chest pain and palpitations.  Gastrointestinal: Negative for abdominal pain and vomiting.  Genitourinary: Negative for dysuria and hematuria.  Musculoskeletal: Negative for arthralgias and back pain.  Skin: Negative for color change and rash.  Neurological: Positive for tremors. Negative for seizures and syncope.  All other systems reviewed and are negative.    Physical Exam Updated Vital Signs BP 102/71   Pulse 90   Temp 97.9 F (36.6 C)   Resp 18   Ht 5\' 5"  (1.651 m)   Wt 63.5 kg   SpO2 98%   BMI 23.30 kg/m   Physical Exam Vitals signs and nursing note reviewed.  Constitutional:      General: He is awake.     Appearance: He is well-developed.  HENT:     Head: Normocephalic and atraumatic.  Eyes:     Conjunctiva/sclera: Conjunctivae normal.  Neck:     Musculoskeletal: Neck supple.  Cardiovascular:     Rate and Rhythm: Normal rate and regular rhythm.     Heart sounds: No murmur.  Pulmonary:     Effort: Pulmonary effort is normal. No respiratory distress.     Breath sounds: Normal breath sounds.  Abdominal:     Palpations: Abdomen is soft.     Tenderness: There is no abdominal tenderness.  Skin:    General: Skin is warm and dry.  Neurological:     General: No focal deficit present.     Mental Status: He is alert.     Motor: Tremor present.    Cranial nerves II through XII are grossly intact.  Patient has a bilateral upper extremity tremor but has no pronator drift or finger dysmetria.  Patientspeech is slurred.  ED Treatments / Results  Labs (all labs ordered are listed, but only abnormal results are displayed) Labs Reviewed  COMPREHENSIVE METABOLIC PANEL - Abnormal; Notable for the following components:      Result Value   Sodium 133 (*)    CO2 19 (*)    Glucose, Bld 102 (*)    All other components  within normal limits  CBC WITH DIFFERENTIAL/PLATELET - Abnormal; Notable for the following components:   RBC 3.91 (*)    MCV 101.0 (*)    Platelets 442 (*)    All other components within normal limits  CBG MONITORING, ED - Abnormal; Notable for the following components:   Glucose-Capillary 114 (*)    All other components within normal limits  MAGNESIUM    EKG None  Radiology No results found.  Procedures Procedures (including critical care time)  Medications Ordered in ED Medications - No data to display   Initial Impression / Assessment and Plan / ED Course  I have reviewed the triage vital signs and the nursing notes.  Pertinent labs & imaging results that were available during my care of the patient were reviewed by me and considered in my medical decision making (see chart for details).     Patient is a 52 year old male who presents with above-stated history and exam.  On presentation patient is afebrile stable vital signs.  Exam as above remarkable for bilateral upper extremity tremor as well as slurred speech without any focal neurological deficits.  Patient's abdomen is soft and his lungs are clear to auscultation bilaterally.  He has no active signs of trauma on his chest, abdomen, back, or extremities.  Exam otherwise unremarkable.  Review prior medical records so patient was recently hospitalized in December 2019 for alcohol detoxification and hyponatremia.  Given his recent history of hyponatremia in the context of EtOH abuse and patient endorsing EtOH abuse over the last several days CMP was obtained to assess for any significant electrolyte abnormalities.  CMP shows a NA of 133 with a K of 3.8, CL 101, glucose of 102, and unremarkable LFTs.  Magnesium is WNL.  CBC remarkable for WBC 8.5, hemoglobin 13.2, platelets 12.1.  History exam is not consistent with acute CVA, sepsis, meningitis, acute traumatic injury, or other imminently life-threatening etiology.  Impression is  polysubstance abuse including EtOH intoxication.  Given patient denies any SI, HI, or hallucinations I do not believe further psychiatric work-up is indicated at this time.  Prior to discharge patient was observed ambulating without ataxia unassisted emergency department.  He was instructed to follow-up with his PCP in 3 to 5 days and return to the hospital immediately for any chest pain, focal weakness, sudden headache, fevers, or other acute concerns.  Final Clinical Impressions(s) / ED Diagnoses   Final diagnoses:  ETOH abuse    ED Discharge Orders    None       Antoine PrimasSmith, Tc Kapusta, MD 02/25/18 2028    Gerhard MunchLockwood, Robert, MD 02/26/18 [phone]

## 2018-02-25 NOTE — ED Triage Notes (Signed)
The pt is here he sayshe has not slept  For 5 days he drinks every day  He had moonshine earlier 3 20 ounce beers daily he reported something about [facility]  He came into the room reporting that he needes 2 nicotine patches  edp needs to see

## 2018-02-25 NOTE — ED Notes (Signed)
Spoke to main lab they will add on Mag to specimens already in lab.

## 2018-11-07 DEATH — deceased
